# Patient Record
Sex: Female | Born: 1995 | Race: White | Hispanic: No | Marital: Single | Smoking: Never smoker
Health system: Southern US, Community
[De-identification: ages and names within clinical notes are randomized; demographics above are authoritative.]

---
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
## 2015-12-08 ENCOUNTER — Encounter: Payer: Self-pay | Admitting: Certified Nurse Midwife

## 2015-12-08 ENCOUNTER — Ambulatory Visit (INDEPENDENT_AMBULATORY_CARE_PROVIDER_SITE_OTHER): Payer: Managed Care, Other (non HMO) | Admitting: Certified Nurse Midwife

## 2015-12-08 VITALS — BP 122/60 | HR 68 | Resp 14 | Ht 70.0 in | Wt 186.0 lb

## 2015-12-08 DIAGNOSIS — Z0000 Encounter for general adult medical examination without abnormal findings: Secondary | ICD-10-CM | POA: Diagnosis not present

## 2015-12-08 DIAGNOSIS — Z01419 Encounter for gynecological examination (general) (routine) without abnormal findings: Secondary | ICD-10-CM

## 2015-12-08 LAB — CBC
HEMATOCRIT: 37 % (ref 36.0–46.0)
HEMOGLOBIN: 12.8 g/dL (ref 12.0–15.0)
MCH: 30.8 pg (ref 26.0–34.0)
MCHC: 34.6 g/dL (ref 30.0–36.0)
MCV: 89.2 fL (ref 78.0–100.0)
MPV: 9.7 fL (ref 8.6–12.4)
Platelets: 233 10*3/uL (ref 150–400)
RBC: 4.15 MIL/uL (ref 3.87–5.11)
RDW: 13.8 % (ref 11.5–15.5)
WBC: 7.7 10*3/uL (ref 4.0–10.5)

## 2015-12-08 LAB — TSH: TSH: 0.679 u[IU]/mL (ref 0.350–4.500)

## 2015-12-08 NOTE — Patient Instructions (Signed)
General topics  Next pap or exam is  due in 1 year Take a Women's multivitamin Take 1200 mg. of calcium daily - prefer dietary If any concerns in interim to call back  Breast Self-Awareness Practicing breast self-awareness may pick up problems early, prevent significant medical complications, and possibly save your life. By practicing breast self-awareness, you can become familiar with how your breasts look and feel and if your breasts are changing. This allows you to notice changes early. It can also offer you some reassurance that your breast health is good. One way to learn what is normal for your breasts and whether your breasts are changing is to do a breast self-exam. If you find a lump or something that was not present in the past, it is best to contact your caregiver right away. Other findings that should be evaluated by your caregiver include nipple discharge, especially if it is bloody; skin changes or reddening; areas where the skin seems to be pulled in (retracted); or new lumps and bumps. Breast pain is seldom associated with cancer (malignancy), but should also be evaluated by a caregiver. BREAST SELF-EXAM The best time to examine your breasts is 5 7 days after your menstrual period is over.  ExitCare Patient Information 2013 [employer].   Exercise to Stay Healthy Exercise helps you become and stay healthy. EXERCISE IDEAS AND TIPS Choose exercises that:  You enjoy.  Fit into your day. You do not need to exercise really hard to be healthy. You can do exercises at a slow or medium level and stay healthy. You can:  Stretch before and after working out.  Try yoga, Pilates, or tai chi.  Lift weights.  Walk fast, swim, jog, run, climb stairs, bicycle, dance, or rollerskate.  Take aerobic classes. Exercises that burn about 150 calories:  Running 1  miles in 15 minutes.  Playing volleyball for 45 to 60 minutes.  Washing and waxing a car for 45 to 60  minutes.  Playing touch football for 45 minutes.  Walking 1  miles in 35 minutes.  Pushing a stroller 1  miles in 30 minutes.  Playing basketball for 30 minutes.  Raking leaves for 30 minutes.  Bicycling 5 miles in 30 minutes.  Walking 2 miles in 30 minutes.  Dancing for 30 minutes.  Shoveling snow for 15 minutes.  Swimming laps for 20 minutes.  Walking up stairs for 15 minutes.  Bicycling 4 miles in 15 minutes.  Gardening for 30 to 45 minutes.  Jumping rope for 15 minutes.  Washing windows or floors for 45 to 60 minutes. Document Released: 01/14/2011 Document Revised: 03/05/2012 Document Reviewed: 01/14/2011 ExitCare Patient Information 2013 [employer].   Other topics ( that may be useful information):    Sexually Transmitted Disease Sexually transmitted disease (STD) refers to any infection that is passed from person to person during sexual activity. This may happen by way of saliva, semen, blood, vaginal mucus, or urine. Common STDs include:  Gonorrhea.  Chlamydia.  Syphilis.  HIV/AIDS.  Genital herpes.  Hepatitis B and C.  Trichomonas.  Human papillomavirus (HPV).  Pubic lice. CAUSES  An STD may be spread by bacteria, virus, or parasite. A person can get an STD by:  Sexual intercourse with an infected person.  Sharing sex toys with an infected person.  Sharing needles with an infected person.  Having intimate contact with the genitals, mouth, or rectal areas of an infected person. SYMPTOMS  Some people may not have any symptoms, but   they can still pass the infection to others. Different STDs have different symptoms. Symptoms include:  Painful or bloody urination.  Pain in the pelvis, abdomen, vagina, anus, throat, or eyes.  Skin rash, itching, irritation, growths, or sores (lesions). These usually occur in the genital or anal area.  Abnormal vaginal discharge.  Penile discharge in men.  Soft, flesh-colored skin growths in the  genital or anal area.  Fever.  Pain or bleeding during sexual intercourse.  Swollen glands in the groin area.  Yellow skin and eyes (jaundice). This is seen with hepatitis. DIAGNOSIS  To make a diagnosis, your caregiver may:  Take a medical history.  Perform a physical exam.  Take a specimen (culture) to be examined.  Examine a sample of discharge under a microscope.  Perform blood test TREATMENT   Chlamydia, gonorrhea, trichomonas, and syphilis can be cured with antibiotic medicine.  Genital herpes, hepatitis, and HIV can be treated, but not cured, with prescribed medicines. The medicines will lessen the symptoms.  Genital warts from HPV can be treated with medicine or by freezing, burning (electrocautery), or surgery. Warts may come back.  HPV is a virus and cannot be cured with medicine or surgery.However, abnormal areas may be followed very closely by your caregiver and may be removed from the cervix, vagina, or vulva through office procedures or surgery. If your diagnosis is confirmed, your recent sexual partners need treatment. This is true even if they are symptom-free or have a negative culture or evaluation. They should not have sex until their caregiver says it is okay. HOME CARE INSTRUCTIONS  All sexual partners should be informed, tested, and treated for all STDs.  Take your antibiotics as directed. Finish them even if you start to feel better.  Only take over-the-counter or prescription medicines for pain, discomfort, or fever as directed by your caregiver.  Rest.  Eat a balanced diet and drink enough fluids to keep your urine clear or pale yellow.  Do not have sex until treatment is completed and you have followed up with your caregiver. STDs should be checked after treatment.  Keep all follow-up appointments, Pap tests, and blood tests as directed by your caregiver.  Only use latex condoms and water-soluble lubricants during sexual activity. Do not use  petroleum jelly or oils.  Avoid alcohol and illegal drugs.  Get vaccinated for HPV and hepatitis. If you have not received these vaccines in the past, talk to your caregiver about whether one or both might be right for you.  Avoid risky sex practices that can break the skin. The only way to avoid getting an STD is to avoid all sexual activity.Latex condoms and dental dams (for oral sex) will help lessen the risk of getting an STD, but will not completely eliminate the risk. SEEK MEDICAL CARE IF:   You have a fever.  You have any new or worsening symptoms. Document Released: 03/04/2003 Document Revised: 03/05/2012 Document Reviewed: 03/11/2011 [facility] Patient Information 2013 [employer].    Domestic Abuse You are being battered or abused if someone close to you hits, pushes, or physically hurts you in any way. You also are being abused if you are forced into activities. You are being sexually abused if you are forced to have sexual contact of any kind. You are being emotionally abused if you are made to feel worthless or if you are constantly threatened. It is important to remember that help is available. No one has the right to abuse you. PREVENTION OF FURTHER  ABUSE  Learn the warning signs of danger. This varies with situations but may include: the use of alcohol, threats, isolation from friends and family, or forced sexual contact. Leave if you feel that violence is going to occur.  If you are attacked or beaten, report it to the police so the abuse is documented. You do not have to press charges. The police can protect you while you or the attackers are leaving. Get the officer's name and badge number and a copy of the report.  Find someone you can trust and tell them what is happening to you: your caregiver, a nurse, clergy member, close friend or family member. Feeling ashamed is natural, but remember that you have done nothing wrong. No one deserves abuse. Document Released:  12/09/2000 Document Revised: 03/05/2012 Document Reviewed: 02/17/2011 ExitCare Patient Information 2013 [employer].    How Much is Too Much Alcohol? Drinking too much alcohol can cause injury, accidents, and health problems. These types of problems can include:   Car crashes.  Falls.  Family fighting (domestic violence).  Drowning.  Fights.  Injuries.  Burns.  Damage to certain organs.  Having a baby with birth defects. ONE DRINK CAN BE TOO MUCH WHEN YOU ARE:  Working.  Pregnant or breastfeeding.  Taking medicines. Ask your doctor.  Driving or planning to drive. If you or someone you know has a drinking problem, get help from a doctor.  Document Released: 10/08/2009 Document Revised: 03/05/2012 Document Reviewed: 10/08/2009 ExitCare Patient Information 2013 [employer].   Smoking Hazards Smoking cigarettes is extremely bad for your health. Tobacco smoke has over 200 known poisons in it. There are over 60 chemicals in tobacco smoke that cause cancer. Some of the chemicals found in cigarette smoke include:   Cyanide.  Benzene.  Formaldehyde.  Methanol (wood alcohol).  Acetylene (fuel used in welding torches).  Ammonia. Cigarette smoke also contains the poisonous gases nitrogen oxide and carbon monoxide.  Cigarette smokers have an increased risk of many serious medical problems and Smoking causes approximately:  90% of all lung cancer deaths in men.  80% of all lung cancer deaths in women.  90% of deaths from chronic obstructive lung disease. Compared with nonsmokers, smoking increases the risk of:  Coronary heart disease by 2 to 4 times.  Stroke by 2 to 4 times.  Men developing lung cancer by 23 times.  Women developing lung cancer by 13 times.  Dying from chronic obstructive lung diseases by 12 times.  . Smoking is the most preventable cause of death and disease in our society.  WHY IS SMOKING ADDICTIVE?  Nicotine is the chemical  agent in tobacco that is capable of causing addiction or dependence.  When you smoke and inhale, nicotine is absorbed rapidly into the bloodstream through your lungs. Nicotine absorbed through the lungs is capable of creating a powerful addiction. Both inhaled and non-inhaled nicotine may be addictive.  Addiction studies of cigarettes and spit tobacco show that addiction to nicotine occurs mainly during the teen years, when young people begin using tobacco products. WHAT ARE THE BENEFITS OF QUITTING?  There are many health benefits to quitting smoking.   Likelihood of developing cancer and heart disease decreases. Health improvements are seen almost immediately.  Blood pressure, pulse rate, and breathing patterns start returning to normal soon after quitting. QUITTING SMOKING   American Lung Association - 1-800-LUNGUSA  American Cancer Society - 1-800-ACS-2345 Document Released: 01/19/2005 Document Revised: 03/05/2012 Document Reviewed: 09/23/2009 ExitCare Patient Information 2013 ExitCare,   LLC.   Stress Management Stress is a state of physical or mental tension that often results from changes in your life or normal routine. Some common causes of stress are:  Death of a loved one.  Injuries or severe illnesses.  Getting fired or changing jobs.  Moving into a new home. Other causes may be:  Sexual problems.  Business or financial losses.  Taking on a large debt.  Regular conflict with someone at home or at work.  Constant tiredness from lack of sleep. It is not just bad things that are stressful. It may be stressful to:  Win the lottery.  Get married.  Buy a new car. The amount of stress that can be easily tolerated varies from person to person. Changes generally cause stress, regardless of the types of change. Too much stress can affect your health. It may lead to physical or emotional problems. Too little stress (boredom) may also become stressful. SUGGESTIONS TO  REDUCE STRESS:  Talk things over with your family and friends. It often is helpful to share your concerns and worries. If you feel your problem is serious, you may want to get help from a professional counselor.  Consider your problems one at a time instead of lumping them all together. Trying to take care of everything at once may seem impossible. List all the things you need to do and then start with the most important one. Set a goal to accomplish 2 or 3 things each day. If you expect to do too many in a single day you will naturally fail, causing you to feel even more stressed.  Do not use alcohol or drugs to relieve stress. Although you may feel better for a short time, they do not remove the problems that caused the stress. They can also be habit forming.  Exercise regularly - at least 3 times per week. Physical exercise can help to relieve that "uptight" feeling and will relax you.  The shortest distance between despair and hope is often a good night's sleep.  Go to bed and get up on time allowing yourself time for appointments without being rushed.  Take a short "time-out" period from any stressful situation that occurs during the day. Close your eyes and take some deep breaths. Starting with the muscles in your face, tense them, hold it for a few seconds, then relax. Repeat this with the muscles in your neck, shoulders, hand, stomach, back and legs.  Take good care of yourself. Eat a balanced diet and get plenty of rest.  Schedule time for having fun. Take a break from your daily routine to relax. HOME CARE INSTRUCTIONS   Call if you feel overwhelmed by your problems and feel you can no longer manage them on your own.  Return immediately if you feel like hurting yourself or someone else. Document Released: 06/07/2001 Document Revised: 03/05/2012 Document Reviewed: 01/28/2008 ExitCare Patient Information 2013 ExitCare, [employer].   

## 2015-12-08 NOTE — Progress Notes (Signed)
Patient ID: Lindsey Galloway, female   DOB: February 22, 1996, 19 y.o.   MRN: [id number] 19 y.o. G0P0000 Single  Caucasian Fe here to establish gyn care and annual exam. Periods duration 7-8 days, 1-3 days cramping with OTC use,moderate to light. Uses Motrin for relief. No vomiting with period. Sees urgent care, prn. Fasted for Labs. Mother with patient, patient gave verbal consent for interview and all questions and management. Prefers mother not in for exam. No other health issues today.  Patient's last menstrual period was 12/04/2015.          Sexually active: No.  The current method of family planning is abstinence.    Exercising: Yes.    yoga  Smoker:  no  Health Maintenance: Pap:  Never TDaP: Never Labs: U/A- WNL Self Breast Exams: yes once a month   reports that she has never smoked. She has never used smokeless tobacco. She reports that she does not drink alcohol or use illicit drugs.  History reviewed. No pertinent past medical history.  History reviewed. No pertinent past surgical history.  Current Outpatient Prescriptions  Medication Sig Dispense Refill  . MAGNESIUM CARBONATE PO Take by mouth.    . Probiotic Product (PROBIOTIC ADVANCED PO) Take by mouth.     No current facility-administered medications for this visit.    Family History  Problem Relation Age of Onset  . Breast cancer Maternal Aunt   . Thyroid cancer Maternal Aunt   . Ovarian cancer Maternal Aunt   . Breast cancer Paternal Aunt   . Hypertension Paternal Aunt   . Diabetes Paternal Uncle   . Bladder Cancer Maternal Grandfather   . Diabetes Paternal Grandmother   . Congestive Heart Failure Paternal Grandfather     ROS:  Pertinent items are noted in HPI.  Otherwise, a comprehensive ROS was negative.  Exam:   BP 122/60 mmHg  Pulse 68  Resp 14  Ht  (1.778 m)  Wt 186 lb (84.369 kg)  BMI 26.69 kg/m2  LMP 12/04/2015 Height:  (177.8 cm) Ht Readings from Last 3 Encounters:  12/08/15  (1.778 m)     General appearance: alert, cooperative and appears stated age Head: Normocephalic, without obvious abnormality, atraumatic Neck: no adenopathy, supple, symmetrical, trachea midline and thyroid normal to inspection and palpation and non-palpable Lungs: clear to auscultation bilaterally Breasts: normal appearance, no masses or tenderness, No nipple retraction or dimpling, No nipple discharge or bleeding, No axillary or supraclavicular adenopathy Heart: regular rate and rhythm Abdomen: soft, non-tender; no masses,  no organomegaly Extremities: extremities normal, atraumatic, no cyanosis or edema Skin: Skin color, texture, turgor normal. No rashes or lesions Lymph nodes: Cervical, supraclavicular, and axillary nodes normal. No abnormal inguinal nodes palpated Neurologic: Grossly normal   Pelvic: External genitalia:  no lesions              Urethra:  normal appearing urethra with no masses, tenderness or lesions              Bartholin's and Skene's: normal                 Vagina: normal appearing vagina with normal color and discharge, no lesions              Cervix: normal, non tender, no lesions diva cup present removed for exam with scant blood noted              Pap taken: No. Bimanual Exam:  Uterus:  normal size, contour, position,  consistency, mobility, non-tender and anteverted              Adnexa: normal adnexa and no mass, fullness, tenderness               Rectovaginal: Confirms               Anus:  normal appearance  Chaperone present: yes.  A:  Well Woman with normal exam  Contraception abstinence  Screening labs  Gardasil Candidate   P:   Reviewed health and wellness pertinent to exam  Labs: TSH, CBC,Hep. B,C  Aware of benefits declines  Pap smear as above not taken   counseled on breast self exam, STD prevention, HIV risk factors and prevention, adequate intake of calcium and vitamin D, diet and exercise  return annually or prn  An After Visit Summary was  printed and given to the patient.

## 2015-12-09 LAB — HEPATITIS C ANTIBODY: HCV Ab: NEGATIVE

## 2015-12-09 LAB — HEPATITIS B SURFACE ANTIBODY,QUALITATIVE: HEP B S AB: NEGATIVE

## 2015-12-09 NOTE — Progress Notes (Signed)
Reviewed personally.  M. Suzanne Vanilla [place]ington, MD.  

## 2016-03-07 ENCOUNTER — Telehealth: Payer: Self-pay | Admitting: Certified Nurse Midwife

## 2016-03-07 NOTE — Telephone Encounter (Signed)
Patient complains about bump on head that's been there for 2 weeks now. Patient says it is dark red and tender to the touch. Best # to reach: [phone]

## 2016-03-07 NOTE — Telephone Encounter (Signed)
Left message to call Kaitlyn at [phone]. 

## 2016-03-08 NOTE — Telephone Encounter (Signed)
Spoke with patient. Patient states that she has a bump on her head that appeared 2 and 1/2 weeks ago. Reports the bump is tender to the touch and dark red in color. States that the bump will decrease in size intermittently. Bump is located on the back right side of her head. Advised she will need to be seen with her PCP for further evaluation and treatment. She is agreeable and will contact her PCP to schedule an appointment.  Routing to provider for final review. Patient agreeable to disposition. Will close encounter.

## 2018-08-14 ENCOUNTER — Ambulatory Visit (INDEPENDENT_AMBULATORY_CARE_PROVIDER_SITE_OTHER): Payer: Managed Care, Other (non HMO) | Admitting: Certified Nurse Midwife

## 2018-08-14 ENCOUNTER — Other Ambulatory Visit: Payer: Self-pay

## 2018-08-14 ENCOUNTER — Encounter: Payer: Self-pay | Admitting: Certified Nurse Midwife

## 2018-08-14 ENCOUNTER — Other Ambulatory Visit (HOSPITAL_COMMUNITY)
Admission: RE | Admit: 2018-08-14 | Discharge: 2018-08-14 | Disposition: A | Discharge status: Home / Self Care | Payer: Managed Care, Other (non HMO) | Source: Ambulatory Visit | Attending: Obstetrics & Gynecology | Admitting: Obstetrics & Gynecology

## 2018-08-14 VITALS — BP 104/64 | HR 68 | Resp 16 | Ht 70.25 in | Wt 186.0 lb

## 2018-08-14 DIAGNOSIS — Z124 Encounter for screening for malignant neoplasm of cervix: Secondary | ICD-10-CM

## 2018-08-14 DIAGNOSIS — N920 Excessive and frequent menstruation with regular cycle: Secondary | ICD-10-CM

## 2018-08-14 DIAGNOSIS — L989 Disorder of the skin and subcutaneous tissue, unspecified: Secondary | ICD-10-CM | POA: Diagnosis not present

## 2018-08-14 DIAGNOSIS — Z01419 Encounter for gynecological examination (general) (routine) without abnormal findings: Secondary | ICD-10-CM

## 2018-08-14 NOTE — Progress Notes (Signed)
22 y.o. G0P0000 Single  Caucasian Fe here for annual exam. Periods still 6-7 days using Diva cup and empties 2-3 times daily for 5 days and then light. Some cramping but uses Alieve with good response. Living on her own now.Has noted small red area on head that comes and goes for the past few years. Did not have as a child. Please check. Sees Urgent care if needed. Had physical last year to work in [place]care [place], [place] 6 months. Senior year in college may consider [employer] [place]Not sexually active ever. Declines all immunizations at this time. No health issues today.  Last LMP 08/04/18         Sexually active: No  The current method of family planning is abstinence.    Exercising: Yes.    walking Smoker:  no  Review of Systems  Constitutional: Negative.   HENT: Negative.   Eyes: Negative.   Respiratory: Negative.   Cardiovascular: Negative.   Gastrointestinal: Negative.   Genitourinary:       Painful cycles  Musculoskeletal: Negative.   Skin: Negative.   Neurological: Negative.   Endo/Heme/Allergies: Negative.   Psychiatric/Behavioral: Negative.     Health Maintenance: Pap:  none History of Abnormal Pap: no MMG:  none Self Breast exams: yes Colonoscopy:  none BMD:   none TDaP:  Never done, declined Shingles: not done Pneumonia: not done Hep C and HIV: hep c neg 2016 Labs: yes   reports that she has never smoked. She has never used smokeless tobacco. She reports that she does not drink alcohol or use drugs.  No past medical history on file.  No past surgical history on file.  Current Outpatient Medications  Medication Sig Dispense Refill  . MAGNESIUM CARBONATE PO Take by mouth.    . Probiotic Product (PROBIOTIC ADVANCED PO) Take by mouth.     No current facility-administered medications for this visit.     Family History  Problem Relation Age of Onset  . Breast cancer Maternal Aunt   . Thyroid cancer Maternal Aunt   . Ovarian cancer Maternal Aunt   .  Breast cancer Paternal Aunt   . Hypertension Paternal Aunt   . Diabetes Paternal Uncle   . Bladder Cancer Maternal Grandfather   . Diabetes Paternal Grandmother   . Congestive Heart Failure Paternal Grandfather     ROS:  Pertinent items are noted in HPI.  Otherwise, a comprehensive ROS was negative.  Exam:   There were no vitals taken for this visit.   Ht Readings from Last 3 Encounters:  12/08/15 5\' 10"  (1.778 m)    General appearance: alert, cooperative and appears stated age Head: Normocephalic, without obvious abnormality, atraumatic, small quarter size ? Hemangioma? noted on scalp, non tender Neck: no adenopathy, supple, symmetrical, trachea midline and thyroid normal to inspection and palpation Lungs: clear to auscultation bilaterally Breasts: normal appearance, no masses or tenderness, No nipple retraction or dimpling, No nipple discharge or bleeding, No axillary or supraclavicular adenopathy Heart: regular rate and rhythm Abdomen: soft, non-tender; no masses,  no organomegaly Extremities: extremities normal, atraumatic, no cyanosis or edema Skin: Skin color, texture, turgor normal. No rashes or lesions Lymph nodes: Cervical, supraclavicular, and axillary nodes normal. No abnormal inguinal nodes palpated Neurologic: Grossly normal   Pelvic: External genitalia:  no lesions              Urethra:  normal appearing urethra with no masses, tenderness or lesions  Bartholin's and Skene's: normal                 Vagina: normal appearing vagina with normal color and discharge, no lesions              Cervix: no cervical motion tenderness, no lesions and nulliparous appearance              Pap taken: Yes.   Bimanual Exam:  Uterus:  normal size, contour, position, consistency, mobility, non-tender and anteverted              Adnexa: normal adnexa and no mass, fullness, tenderness               Rectovaginal: Confirms               Anus:  Normal appearance, no  lesions  Chaperone present: yes  A:  Well Woman with normal exam  Contraception none needed or desired for cycle control, never sexually active  Menorrhagia with regular cycle  Gardasil candidate declined information, aware  Scalp lesion  P:   Reviewed health and wellness pertinent to exam  Discussed can use OCP for cycle control, patient does not feel needed.   Lab: CBC  Given dermatology information to call regarding evaluation of scalp lesion. Patient will call,does not need referral.  Pap smear: yes   counseled on breast self exam, mammography screening, STD prevention, HIV risk factors and prevention, adequate intake of calcium and vitamin D, diet and exercise  return annually or prn  An After Visit Summary was printed and given to the patient.

## 2018-08-14 NOTE — Patient Instructions (Addendum)
General topics  Next pap or exam is  due in 1 year Take a Women's multivitamin Take 1200 mg. of calcium daily - prefer dietary If any concerns in interim to call back  Breast Self-Awareness Practicing breast self-awareness may pick up problems early, prevent significant medical complications, and possibly save your life. By practicing breast self-awareness, you can become familiar with how your breasts look and feel and if your breasts are changing. This allows you to notice changes early. It can also offer you some reassurance that your breast health is good. One way to learn what is normal for your breasts and whether your breasts are changing is to do a breast self-exam. If you find a lump or something that was not present in the past, it is best to contact your caregiver right away. Other findings that should be evaluated by your caregiver include nipple discharge, especially if it is bloody; skin changes or reddening; areas where the skin seems to be pulled in (retracted); or new lumps and bumps. Breast pain is seldom associated with cancer (malignancy), but should also be evaluated by a caregiver. BREAST SELF-EXAM The best time to examine your breasts is 5 7 days after your menstrual period is over.  ExitCare Patient Information 2013 ExitCare[employer].   Exercise to Stay Healthy Exercise helps you become and stay healthy. EXERCISE IDEAS AND TIPS Choose exercises that:  You enjoy.  Fit into your day. You do not need to exercise really hard to be healthy. You can do exercises at a slow or medium level and stay healthy. You can:  Stretch before and after working out.  Try yoga, Pilates, or tai chi.  Lift weights.  Walk fast, swim, jog, run, climb stairs, bicycle, dance, or rollerskate.  Take aerobic classes. Exercises that burn about 150 calories:  Running 1  miles in 15 minutes.  Playing volleyball for 45 to 60 minutes.  Washing and waxing a car for 45 to 60  minutes.  Playing touch football for 45 minutes.  Walking 1  miles in 35 minutes.  Pushing a stroller 1  miles in 30 minutes.  Playing basketball for 30 minutes.  Raking leaves for 30 minutes.  Bicycling 5 miles in 30 minutes.  Walking 2 miles in 30 minutes.  Dancing for 30 minutes.  Shoveling snow for 15 minutes.  Swimming laps for 20 minutes.  Walking up stairs for 15 minutes.  Bicycling 4 miles in 15 minutes.  Gardening for 30 to 45 minutes.  Jumping rope for 15 minutes.  Washing windows or floors for 45 to 60 minutes. Document Released: 01/14/2011 Document Revised: 03/05/2012 Document Reviewed: 01/14/2011 ExitCare Patient Information 2013 ExitCare[employer].   Other topics ( that may be useful information):    Sexually Transmitted Disease Sexually transmitted disease (STD) refers to any infection that is passed from person to person during sexual activity. This may happen by way of saliva, semen, blood, vaginal mucus, or urine. Common STDs include:  Gonorrhea.  Chlamydia.  Syphilis.  HIV/AIDS.  Genital herpes.  Hepatitis B and C.  Trichomonas.  Human papillomavirus (HPV).  Pubic lice. CAUSES  An STD may be spread by bacteria, virus, or parasite. A person can get an STD by:  Sexual intercourse with an infected person.  Sharing sex toys with an infected person.  Sharing needles with an infected person.  Having intimate contact with the genitals, mouth, or rectal areas of an infected person. SYMPTOMS  Some people may not have any symptoms, but   they can still pass the infection to others. Different STDs have different symptoms. Symptoms include:  Painful or bloody urination.  Pain in the pelvis, abdomen, vagina, anus, throat, or eyes.  Skin rash, itching, irritation, growths, or sores (lesions). These usually occur in the genital or anal area.  Abnormal vaginal discharge.  Penile discharge in men.  Soft, flesh-colored skin growths in the  genital or anal area.  Fever.  Pain or bleeding during sexual intercourse.  Swollen glands in the groin area.  Yellow skin and eyes (jaundice). This is seen with hepatitis. DIAGNOSIS  To make a diagnosis, your caregiver may:  Take a medical history.  Perform a physical exam.  Take a specimen (culture) to be examined.  Examine a sample of discharge under a microscope.  Perform blood test TREATMENT   Chlamydia, gonorrhea, trichomonas, and syphilis can be cured with antibiotic medicine.  Genital herpes, hepatitis, and HIV can be treated, but not cured, with prescribed medicines. The medicines will lessen the symptoms.  Genital warts from HPV can be treated with medicine or by freezing, burning (electrocautery), or surgery. Warts may come back.  HPV is a virus and cannot be cured with medicine or surgery.However, abnormal areas may be followed very closely by your caregiver and may be removed from the cervix, vagina, or vulva through office procedures or surgery. If your diagnosis is confirmed, your recent sexual partners need treatment. This is true even if they are symptom-free or have a negative culture or evaluation. They should not have sex until their caregiver says it is okay. HOME CARE INSTRUCTIONS  All sexual partners should be informed, tested, and treated for all STDs.  Take your antibiotics as directed. Finish them even if you start to feel better.  Only take over-the-counter or prescription medicines for pain, discomfort, or fever as directed by your caregiver.  Rest.  Eat a balanced diet and drink enough fluids to keep your urine clear or pale yellow.  Do not have sex until treatment is completed and you have followed up with your caregiver. STDs should be checked after treatment.  Keep all follow-up appointments, Pap tests, and blood tests as directed by your caregiver.  Only use latex condoms and water-soluble lubricants during sexual activity. Do not use  petroleum jelly or oils.  Avoid alcohol and illegal drugs.  Get vaccinated for HPV and hepatitis. If you have not received these vaccines in the past, talk to your caregiver about whether one or both might be right for you.  Avoid risky sex practices that can break the skin. The only way to avoid getting an STD is to avoid all sexual activity.Latex condoms and dental dams (for oral sex) will help lessen the risk of getting an STD, but will not completely eliminate the risk. SEEK MEDICAL CARE IF:   You have a fever.  You have any new or worsening symptoms. Document Released: 03/04/2003 Document Revised: 03/05/2012 Document Reviewed: 03/11/2011 [facility] Patient Information 2013 [place].    Domestic Abuse You are being battered or abused if someone close to you hits, pushes, or physically hurts you in any way. You also are being abused if you are forced into activities. You are being sexually abused if you are forced to have sexual contact of any kind. You are being emotionally abused if you are made to feel worthless or if you are constantly threatened. It is important to remember that help is available. No one has the right to abuse you. PREVENTION OF FURTHER  ABUSE  Learn the warning signs of danger. This varies with situations but may include: the use of alcohol, threats, isolation from friends and family, or forced sexual contact. Leave if you feel that violence is going to occur.  If you are attacked or beaten, report it to the police so the abuse is documented. You do not have to press charges. The police can protect you while you or the attackers are leaving. Get the officer's name and badge number and a copy of the report.  Find someone you can trust and tell them what is happening to you: your caregiver, a nurse, clergy member, close friend or family member. Feeling ashamed is natural, but remember that you have done nothing wrong. No one deserves abuse. Document Released:  12/09/2000 Document Revised: 03/05/2012 Document Reviewed: 02/17/2011 ExitCare Patient Information 2013 ExitCare, [employer].    How Much is Too Much Alcohol? Drinking too much alcohol can cause injury, accidents, and health problems. These types of problems can include:   Car crashes.  Falls.  Family fighting (domestic violence).  Drowning.  Fights.  Injuries.  Burns.  Damage to certain organs.  Having a baby with birth defects. ONE DRINK CAN BE TOO MUCH WHEN YOU ARE:  Working.  Pregnant or breastfeeding.  Taking medicines. Ask your doctor.  Driving or planning to drive. If you or someone you know has a drinking problem, get help from a doctor.  Document Released: 10/08/2009 Document Revised: 03/05/2012 Document Reviewed: 10/08/2009 ExitCare Patient Information 2013 ExitCare, [employer].   Smoking Hazards Smoking cigarettes is extremely bad for your health. Tobacco smoke has over 200 known poisons in it. There are over 60 chemicals in tobacco smoke that cause cancer. Some of the chemicals found in cigarette smoke include:   Cyanide.  Benzene.  Formaldehyde.  Methanol (wood alcohol).  Acetylene (fuel used in welding torches).  Ammonia. Cigarette smoke also contains the poisonous gases nitrogen oxide and carbon monoxide.  Cigarette smokers have an increased risk of many serious medical problems and Smoking causes approximately:  90% of all lung cancer deaths in men.  80% of all lung cancer deaths in women.  90% of deaths from chronic obstructive lung disease. Compared with nonsmokers, smoking increases the risk of:  Coronary heart disease by 2 to 4 times.  Stroke by 2 to 4 times.  Men developing lung cancer by 23 times.  Women developing lung cancer by 13 times.  Dying from chronic obstructive lung diseases by 12 times.  . Smoking is the most preventable cause of death and disease in our society.  WHY IS SMOKING ADDICTIVE?  Nicotine is the chemical  agent in tobacco that is capable of causing addiction or dependence.  When you smoke and inhale, nicotine is absorbed rapidly into the bloodstream through your lungs. Nicotine absorbed through the lungs is capable of creating a powerful addiction. Both inhaled and non-inhaled nicotine may be addictive.  Addiction studies of cigarettes and spit tobacco show that addiction to nicotine occurs mainly during the teen years, when young people begin using tobacco products. WHAT ARE THE BENEFITS OF QUITTING?  There are many health benefits to quitting smoking.   Likelihood of developing cancer and heart disease decreases. Health improvements are seen almost immediately.  Blood pressure, pulse rate, and breathing patterns start returning to normal soon after quitting. QUITTING SMOKING   American Lung Association - 1-800-LUNGUSA  American Cancer Society - 1-800-ACS-2345 Document Released: 01/19/2005 Document Revised: 03/05/2012 Document Reviewed: 09/23/2009 ExitCare Patient Information 2013 ExitCare,   LLC.   Stress Management Stress is a state of physical or mental tension that often results from changes in your life or normal routine. Some common causes of stress are:  Death of a loved one.  Injuries or severe illnesses.  Getting fired or changing jobs.  Moving into a new home. Other causes may be:  Sexual problems.  Business or financial losses.  Taking on a large debt.  Regular conflict with someone at home or at work.  Constant tiredness from lack of sleep. It is not just bad things that are stressful. It may be stressful to:  Win the lottery.  Get married.  Buy a new car. The amount of stress that can be easily tolerated varies from person to person. Changes generally cause stress, regardless of the types of change. Too much stress can affect your health. It may lead to physical or emotional problems. Too little stress (boredom) may also become stressful. SUGGESTIONS TO  REDUCE STRESS:  Talk things over with your family and friends. It often is helpful to share your concerns and worries. If you feel your problem is serious, you may want to get help from a professional counselor.  Consider your problems one at a time instead of lumping them all together. Trying to take care of everything at once may seem impossible. List all the things you need to do and then start with the most important one. Set a goal to accomplish 2 or 3 things each day. If you expect to do too many in a single day you will naturally fail, causing you to feel even more stressed.  Do not use alcohol or drugs to relieve stress. Although you may feel better for a short time, they do not remove the problems that caused the stress. They can also be habit forming.  Exercise regularly - at least 3 times per week. Physical exercise can help to relieve that "uptight" feeling and will relax you.  The shortest distance between despair and hope is often a good night's sleep.  Go to bed and get up on time allowing yourself time for appointments without being rushed.  Take a short "time-out" period from any stressful situation that occurs during the day. Close your eyes and take some deep breaths. Starting with the muscles in your face, tense them, hold it for a few seconds, then relax. Repeat this with the muscles in your neck, shoulders, hand, stomach, back and legs.  Take good care of yourself. Eat a balanced diet and get plenty of rest.  Schedule time for having fun. Take a break from your daily routine to relax. HOME CARE INSTRUCTIONS   Call if you feel overwhelmed by your problems and feel you can no longer manage them on your own.  Return immediately if you feel like hurting yourself or someone else. Document Released: 06/07/2001 Document Revised: 03/05/2012 Document Reviewed: 01/28/2008 [facility] Patient Information 2013 [place].  Good to see you Schedule dermatology  appointment! Debbi

## 2018-08-15 LAB — CBC
HEMATOCRIT: 38.1 % (ref 34.0–46.6)
Hemoglobin: 12.6 g/dL (ref 11.1–15.9)
MCH: 30.8 pg (ref 26.6–33.0)
MCHC: 33.1 g/dL (ref 31.5–35.7)
MCV: 93 fL (ref 79–97)
PLATELETS: 262 10*3/uL (ref 150–450)
RBC: 4.09 x10E6/uL (ref 3.77–5.28)
RDW: 13.4 % (ref 12.3–15.4)
WBC: 4.6 10*3/uL (ref 3.4–10.8)

## 2018-08-15 LAB — CYTOLOGY - PAP: Diagnosis: NEGATIVE

## 2019-08-16 ENCOUNTER — Encounter: Payer: Self-pay | Admitting: Certified Nurse Midwife

## 2019-08-16 ENCOUNTER — Ambulatory Visit: Payer: Managed Care, Other (non HMO) | Admitting: Certified Nurse Midwife

## 2019-08-16 NOTE — Progress Notes (Deleted)
23 y.o. G0P0000 Single  {Race/ethnicity:17218} Fe here for annual exam.    No LMP recorded.          Sexually active: {yes no:314532}  The current method of family planning is {contraception:315051}.    Exercising: {yes no:314532}  {types:19826} Smoker:  {YES NO:22349}  ROS  Health Maintenance: Pap:  08-14-18 neg History of Abnormal Pap: {YES NO:22349} MMG:  none Self Breast exams: {YES NO:22349} Colonoscopy:  none BMD:   none TDaP:  Never done, declined Shingles: not done Pneumonia: not done Hep C and HIV: hep c neg 2016 Labs: ***   reports that she has never smoked. She has never used smokeless tobacco. She reports that she does not drink alcohol or use drugs.  No past medical history on file.  No past surgical history on file.  Current Outpatient Medications  Medication Sig Dispense Refill  . MAGNESIUM CARBONATE PO Take by mouth.    . Probiotic Product (PROBIOTIC ADVANCED PO) Take by mouth.     No current facility-administered medications for this visit.     Family History  Problem Relation Age of Onset  . Breast cancer Maternal Aunt   . Thyroid cancer Maternal Aunt   . Ovarian cancer Maternal Aunt   . Breast cancer Paternal Aunt   . Hypertension Paternal Aunt   . Diabetes Paternal Uncle   . Bladder Cancer Maternal Grandfather   . Diabetes Paternal Grandmother   . Congestive Heart Failure Paternal Grandfather     ROS:  Pertinent items are noted in HPI.  Otherwise, a comprehensive ROS was negative.  Exam:   There were no vitals taken for this visit.   Ht Readings from Last 3 Encounters:  08/14/18 5' 10.25" (1.784 m)  12/08/15 5\' 10"  (1.778 m)    General appearance: alert, cooperative and appears stated age Head: Normocephalic, without obvious abnormality, atraumatic Neck: no adenopathy, supple, symmetrical, trachea midline and thyroid {EXAM; THYROID:18604} Lungs: clear to auscultation bilaterally Breasts: {Exam; breast:13139::"normal appearance, no  masses or tenderness"} Heart: regular rate and rhythm Abdomen: soft, non-tender; no masses,  no organomegaly Extremities: extremities normal, atraumatic, no cyanosis or edema Skin: Skin color, texture, turgor normal. No rashes or lesions Lymph nodes: Cervical, supraclavicular, and axillary nodes normal. No abnormal inguinal nodes palpated Neurologic: Grossly normal   Pelvic: External genitalia:  no lesions              Urethra:  normal appearing urethra with no masses, tenderness or lesions              Bartholin's and Skene's: normal                 Vagina: normal appearing vagina with normal color and discharge, no lesions              Cervix: {exam; cervix:14595}              Pap taken: {yes no:314532} Bimanual Exam:  Uterus:  {exam; uterus:12215}              Adnexa: {exam; adnexa:12223}               Rectovaginal: Confirms               Anus:  normal sphincter tone, no lesions  Chaperone present: ***  A:  Well Woman with normal exam  P:   Reviewed health and wellness pertinent to exam  Pap smear: {YES NO:22349}  {plan; gyn:5269::"mammogram","pap smear","return annually or prn"}  An After  Visit Summary was printed and given to the patient.

## 2020-01-09 ENCOUNTER — Other Ambulatory Visit: Payer: Self-pay

## 2020-01-09 ENCOUNTER — Ambulatory Visit: Payer: Managed Care, Other (non HMO) | Admitting: Family Medicine

## 2020-01-09 ENCOUNTER — Encounter: Payer: Self-pay | Admitting: Family Medicine

## 2020-01-09 VITALS — BP 122/77 | HR 68 | Temp 97.9°F | Ht 70.25 in | Wt 208.2 lb

## 2020-01-09 DIAGNOSIS — Z0000 Encounter for general adult medical examination without abnormal findings: Secondary | ICD-10-CM

## 2020-01-09 DIAGNOSIS — Z0001 Encounter for general adult medical examination with abnormal findings: Secondary | ICD-10-CM

## 2020-01-09 DIAGNOSIS — N920 Excessive and frequent menstruation with regular cycle: Secondary | ICD-10-CM

## 2020-01-09 MED ORDER — ETONOGESTREL-ETHINYL ESTRADIOL 0.12-0.015 MG/24HR VA RING: VAGINAL_RING | VAGINAL | 12 refills | Status: AC

## 2020-01-09 NOTE — Patient Instructions (Addendum)
Start NUVARING with the start of your next period  Immunizations that would be recommended: MMR, varicella, Tdap, HPV and annual flu vaccine Also consider hepatitis A and B vaccines  If you have lab work done today you will be contacted with your lab results within the next 2 weeks.  If you have not heard from [place] then please contact us. The fastest way to get your results is to register for My Chart.   IF you received an x-ray today, you will receive an invoice from [facility]. Please contact [facility] at [phone] with questions or concerns regarding your invoice.   IF you received labwork today, you will receive an invoice from [facility]. Please contact [facility] at [phone] with questions or concerns regarding your invoice.   Our billing staff will not be able to assist you with questions regarding bills from these companies.  You will be contacted with the lab results as soon as they are available. The fastest way to get your results is to activate your My Chart account. Instructions are located on the last page of this paperwork. If you have not heard from [place] regarding the results in 2 weeks, please contact this office.     Preventive Care 9-44 Years Old, Female Preventive care refers to visits with your health care provider and lifestyle choices that can promote health and wellness. This includes:  A yearly physical exam. This may also be called an annual well check.  Regular dental visits and eye exams.  Immunizations.  Screening for certain conditions.  Healthy lifestyle choices, such as eating a healthy diet, getting regular exercise, not using drugs or products that contain nicotine and tobacco, and limiting alcohol use. What can I expect for my preventive care visit? Physical exam Your health care provider will check your:  Height and weight. This may be used to calculate body mass index (BMI), which tells if you are at a healthy  weight.  Heart rate and blood pressure.  Skin for abnormal spots. Counseling Your health care provider may ask you questions about your:  Alcohol, tobacco, and drug use.  Emotional well-being.  Home and relationship well-being.  Sexual activity.  Eating habits.  Work and work Statistician.  Method of birth control.  Menstrual cycle.  Pregnancy history. What immunizations do I need?  Influenza (flu) vaccine  This is recommended every year. Tetanus, diphtheria, and pertussis (Tdap) vaccine  You may need a Td booster every 10 years. Varicella (chickenpox) vaccine  You may need this if you have not been vaccinated. Human papillomavirus (HPV) vaccine  If recommended by your health care provider, you may need three doses over 6 months. Measles, mumps, and rubella (MMR) vaccine  You may need at least one dose of MMR. You may also need a second dose. Meningococcal conjugate (MenACWY) vaccine  One dose is recommended if you are age 61-21 years and a first-year college student living in a residence hall, or if you have one of several medical conditions. You may also need additional booster doses. Pneumococcal conjugate (PCV13) vaccine  You may need this if you have certain conditions and were not previously vaccinated. Pneumococcal polysaccharide (PPSV23) vaccine  You may need one or two doses if you smoke cigarettes or if you have certain conditions. Hepatitis A vaccine  You may need this if you have certain conditions or if you travel or work in places where you may be exposed to hepatitis A. Hepatitis B vaccine  You may need this if  you have certain conditions or if you travel or work in places where you may be exposed to hepatitis B. Haemophilus influenzae type b (Hib) vaccine  You may need this if you have certain conditions. You may receive vaccines as individual doses or as more than one vaccine together in one shot (combination vaccines). Talk with your health  care provider about the risks and benefits of combination vaccines. What tests do I need?  Blood tests  Lipid and cholesterol levels. These may be checked every 5 years starting at age 68.  Hepatitis C test.  Hepatitis B test. Screening  Diabetes screening. This is done by checking your blood sugar (glucose) after you have not eaten for a while (fasting).  Sexually transmitted disease (STD) testing.  BRCA-related cancer screening. This may be done if you have a family history of breast, ovarian, tubal, or peritoneal cancers.  Pelvic exam and Pap test. This may be done every 3 years starting at age 74. Starting at age 38, this may be done every 5 years if you have a Pap test in combination with an HPV test. Talk with your health care provider about your test results, treatment options, and if necessary, the need for more tests. Follow these instructions at home: Eating and drinking   Eat a diet that includes fresh fruits and vegetables, whole grains, lean protein, and low-fat dairy.  Take vitamin and mineral supplements as recommended by your health care provider.  Do not drink alcohol if: ? Your health care provider tells you not to drink. ? You are pregnant, may be pregnant, or are planning to become pregnant.  If you drink alcohol: ? Limit how much you have to 0-1 drink a day. ? Be aware of how much alcohol is in your drink. In the [place] one drink equals one 12 oz bottle of beer (355 mL), one 5 oz glass of wine (148 mL), or one 1 oz glass of hard liquor (44 mL). Lifestyle  Take daily care of your teeth and gums.  Stay active. Exercise for at least 30 minutes on 5 or more days each week.  Do not use any products that contain nicotine or tobacco, such as cigarettes, e-cigarettes, and chewing tobacco. If you need help quitting, ask your health care provider.  If you are sexually active, practice safe sex. Use a condom or other form of birth control (contraception) in order  to prevent pregnancy and STIs (sexually transmitted infections). If you plan to become pregnant, see your health care provider for a preconception visit. What's next?  Visit your health care provider once a year for a well check visit.  Ask your health care provider how often you should have your eyes and teeth checked.  Stay up to date on all vaccines. This information is not intended to replace advice given to you by your health care provider. Make sure you discuss any questions you have with your health care provider. Document Revised: 08/23/2018 Document Reviewed: 08/23/2018 Elsevier Patient Education  2020 [employer].

## 2020-01-09 NOTE — Progress Notes (Signed)
1/14/20218:17 AM  Lindsey Galloway 08-23-1996, 24 y.o., female [id number]  Chief Complaint  Patient presents with  . Annual Exam    HPI:   Patient is a 24 y.o. female who presents today for CPE  G&Ps: 0 Pap: aug 2019, normal, at Ob[facility]yn STD: has never been sexually active BC : none Menses: regular, heavy and painful, interested in starting nuvaring for management of menorrhagia, LMP dec 25th 2020 Mammogram: none FHX breast/ovarian cancer: maternal and paternal aunts FHx colon cancer: none Exercise/diet: has active job, 3 mile walk 3 times a week, mostly home cooked meals, no restrictions Has no childhood immunizations, starting to consider becoming immunized Eye/dentist: has not seen them in the past year  Lab Results  Component Value Date   WBC 4.6 08/14/2018   HGB 12.6 08/14/2018   HCT 38.1 08/14/2018   MCV 93 08/14/2018   PLT 262 08/14/2018     Hearing Screening   125Hz  250Hz  500Hz  1000Hz  2000Hz  3000Hz  4000Hz  6000Hz  8000Hz   Right ear:           Left ear:             Visual Acuity Screening   Right eye Left eye Both eyes  Without correction:     With correction: 20/20 20/20 20/25     There is no immunization history on file for this patient.  Depression screen PHQ 2/9 01/09/2020  Decreased Interest 0  Down, Depressed, Hopeless 0  PHQ - 2 Score 0    Fall Risk  01/09/2020  Falls in the past year? 0  Number falls in past yr: 0  Injury with Fall? 0     No Known Allergies  Prior to Admission medications   Medication Sig Start Date End Date Taking? Authorizing Provider  MAGNESIUM CARBONATE PO Take by mouth.    [provider]  Probiotic Product (PROBIOTIC ADVANCED PO) Take by mouth.    [provider]    History reviewed. No pertinent past medical history.  History reviewed. No pertinent surgical history.  Social History   Tobacco Use  . Smoking status: Never Smoker  . Smokeless tobacco: Never Used  Substance Use Topics  . Alcohol  use: No    Alcohol/week: 0.0 - 1.0 standard drinks    Family History  Problem Relation Age of Onset  . Breast cancer Maternal Aunt   . Thyroid cancer Maternal Aunt   . Ovarian cancer Maternal Aunt   . Breast cancer Paternal Aunt   . Hypertension Paternal Aunt   . Diabetes Paternal Uncle   . Bladder Cancer Maternal Grandfather   . Diabetes Paternal Grandmother   . Congestive Heart Failure Paternal Grandfather     Review of Systems  Constitutional: Negative for chills and fever.  Respiratory: Negative for cough and shortness of breath.   Cardiovascular: Negative for chest pain, palpitations and leg swelling.  Gastrointestinal: Negative for abdominal pain, nausea and vomiting.  All other systems reviewed and are negative.    OBJECTIVE:  Today's Vitals   01/09/20 0815  BP: 122/77  Pulse: 68  Temp: 97.9 F (36.6 C)  SpO2: 97%  Weight: 208 lb 3.2 oz (94.4 kg)  Height: 5' 10.25" (1.784 m)   Body mass index is 29.66 kg/m.  Wt Readings from Last 3 Encounters:  01/09/20 208 lb 3.2 oz (94.4 kg)  08/14/18 186 lb (84.4 kg)  12/08/15 186 lb (84.4 kg)    Physical Exam Vitals and nursing note reviewed.  Constitutional:  Appearance: She is well-developed.  HENT:     Head: Normocephalic and atraumatic.     Right Ear: Hearing, tympanic membrane, ear canal and external ear normal.     Left Ear: Hearing, tympanic membrane, ear canal and external ear normal.     Mouth/Throat:     Mouth: Mucous membranes are moist.     Pharynx: No oropharyngeal exudate or posterior oropharyngeal erythema.  Eyes:     Extraocular Movements: Extraocular movements intact.     Conjunctiva/sclera: Conjunctivae normal.     Pupils: Pupils are equal, round, and reactive to light.  Neck:     Thyroid: No thyromegaly.  Cardiovascular:     Rate and Rhythm: Normal rate and regular rhythm.     Heart sounds: Normal heart sounds. No murmur. No friction rub. No gallop.   Pulmonary:     Effort:  Pulmonary effort is normal.     Breath sounds: Normal breath sounds. No wheezing, rhonchi or rales.  Abdominal:     General: Bowel sounds are normal. There is no distension.     Palpations: Abdomen is soft. There is no hepatomegaly, splenomegaly or mass.     Tenderness: There is no abdominal tenderness.  Musculoskeletal:        General: Normal range of motion.     Cervical back: Neck supple.     Right lower leg: No edema.     Left lower leg: No edema.  Lymphadenopathy:     Cervical: No cervical adenopathy.  Skin:    General: Skin is warm and dry.  Neurological:     Mental Status: She is alert and oriented to person, place, and time.     Cranial Nerves: No cranial nerve deficit.     Gait: Gait normal.     Deep Tendon Reflexes: Reflexes are normal and symmetric.  Psychiatric:        Mood and Affect: Mood normal.        Behavior: Behavior normal.     No results found for this or any previous visit (from the past 24 hour(s)).  No results found.   ASSESSMENT and PLAN  1. Annual physical exam No concerns per history or exam. HCM reviewed/discussed, including immunizations. Anticipatory guidance regarding healthy weight, lifestyle and choices given.   2. Menorrhagia with regular cycle New med r/se/b and RTC precautions. Patient educational handout given.   Other orders - etonogestrel-ethinyl estradiol (NUVARING) 0.12-0.015 MG/24HR vaginal ring; Insert vaginally and leave in place for 3 consecutive weeks, then remove for 1 week.  Return in about 3 months (around 04/08/2020) for menorrhagia.    Rutherford Guys, MD [facility] [place], [place] [address] Ph.  [phone] Fax [phone]

## 2020-02-10 ENCOUNTER — Ambulatory Visit: Payer: Self-pay

## 2020-02-10 NOTE — Telephone Encounter (Signed)
Patient called stating that she has had a severe to moderate HA for 2 weeks. She denies Hx of headaches.  She has stated Nuvaring 01/09/20. She rates her pain today at 4 and states it is left frontal and travels down into her neck. At max severity she rates pain at 7. She can not touch her chin to her chest.  She has had no head injury. She has no fever or other symptoms. Per protocol patient will go to ER for evaluation of symptoms. Care advice read to patient.  She verbalized understanding.  Reason for Disposition . Stiff neck (can't touch chin to chest)  Answer Assessment - Initial Assessment Questions 1. LOCATION: "Where does it hurt?"      Front left travels to neck 2. ONSET: "When did the headache start?" (Minutes, hours or days)      2 weeks ago 3. PATTERN: "Does the pain come and go, or has it been constant since it started?"     constant 4. SEVERITY: "How bad is the pain?" and "What does it keep you from doing?"  (e.g., Scale 1-10; mild, moderate, or severe)   - MILD (1-3): doesn't interfere with normal activities    - MODERATE (4-7): interferes with normal activities or awakens from sleep    - SEVERE (8-10): excruciating pain, unable to do any normal activities        4 today most severe has been 7 5. RECURRENT SYMPTOM: "Have you ever had headaches before?" If so, ask: "When was the last time?" and "What happened that time?"      no 6. CAUSE: "What do you think is causing the headache?"    Novo ring 7. MIGRAINE: "Have you been diagnosed with migraine headaches?" If so, ask: "Is this headache similar?"      No 8. HEAD INJURY: "Has there been any recent injury to the head?"     No 9. OTHER SYMPTOMS: "Do you have any other symptoms?" (fever, stiff neck, eye pain, sore throat, cold symptoms)     No some days neck is stiff 10. PREGNANCY: "Is there any chance you are pregnant?" "When was your last menstrual period?"       No  Protocols used: HEADACHE-A-AH

## 2020-03-18 ENCOUNTER — Encounter: Payer: Self-pay | Admitting: Certified Nurse Midwife

## 2020-04-06 ENCOUNTER — Other Ambulatory Visit: Payer: Self-pay

## 2020-04-06 ENCOUNTER — Ambulatory Visit: Payer: Managed Care, Other (non HMO) | Admitting: Family Medicine

## 2020-04-06 ENCOUNTER — Ambulatory Visit: Payer: Managed Care, Other (non HMO) | Admitting: Registered Nurse

## 2020-04-06 ENCOUNTER — Encounter: Payer: Self-pay | Admitting: Registered Nurse

## 2020-04-06 VITALS — BP 120/61 | HR 72 | Temp 98.2°F | Resp 18 | Ht 70.25 in | Wt 208.2 lb

## 2020-04-06 DIAGNOSIS — N920 Excessive and frequent menstruation with regular cycle: Secondary | ICD-10-CM

## 2020-04-06 MED ORDER — NORETHINDRONE 0.35 MG PO TABS: 1.0000 | ORAL_TABLET | Freq: Every day | ORAL | 4 refills | Status: AC

## 2020-04-06 NOTE — Patient Instructions (Signed)
° ° ° °  If you have lab work done today you will be contacted with your lab results within the next 2 weeks.  If you have not heard from us then please contact us. The fastest way to get your results is to register for My Chart. ° ° °IF you received an x-ray today, you will receive an invoice from [facility]. Please contact [facility] at [phone] with questions or concerns regarding your invoice.  ° °IF you received labwork today, you will receive an invoice from [facility]. Please contact [facility] at [phone] with questions or concerns regarding your invoice.  ° °Our billing staff will not be able to assist you with questions regarding bills from these companies. ° °You will be contacted with the lab results as soon as they are available. The fastest way to get your results is to activate your My Chart account. Instructions are located on the last page of this paperwork. If you have not heard from us regarding the results in 2 weeks, please contact this office. °  ° ° ° °

## 2020-04-06 NOTE — Progress Notes (Signed)
Established Patient Office Visit  Subjective:  Patient ID: Lindsey Galloway, female    DOB: 1996-07-05  Age: 24 y.o. MRN: [id number]  CC:  Chief Complaint  Patient presents with  . Follow-up    3 month follow up . patient states she had the nuva ring in since her last visit she stated that she has been experiencing  some severe migrains and starts to see spots.    HPI Lindsey Galloway presents for ongoing menorrhagia.  Reports that after last visit, started Nuvaring. Unfortunately, had severe migraines with aura and stopped this a few days early during the first ring. Did not resume. Unfortunately heavy menses persisted.  Willing to try another option at this time.   No lightheadedness, dizziness, shob, chest pain, ongoing headaches, ongoing visual changes.  No past medical history on file.  No past surgical history on file.  Family History  Problem Relation Age of Onset  . Breast cancer Maternal Aunt   . Thyroid cancer Maternal Aunt   . Ovarian cancer Maternal Aunt   . Breast cancer Paternal Aunt   . Hypertension Paternal Aunt   . Diabetes Paternal Uncle   . Bladder Cancer Maternal Grandfather   . Diabetes Paternal Grandmother   . Congestive Heart Failure Paternal Grandfather     Social History   Socioeconomic History  . Marital status: Single    Spouse name: Not on file  . Number of children: Not on file  . Years of education: Not on file  . Highest education level: Not on file  Occupational History  . Not on file  Tobacco Use  . Smoking status: Never Smoker  . Smokeless tobacco: Never Used  Substance and Sexual Activity  . Alcohol use: No    Alcohol/week: 0.0 - 1.0 standard drinks  . Drug use: No  . Sexual activity: Not Currently    Partners: Male    Birth control/protection: Abstinence  Other Topics Concern  . Not on file  Social History Narrative  . Not on file   Social Determinants of Health   Financial Resource Strain:   . Difficulty of Paying Living  Expenses:   Food Insecurity:   . Worried About Charity fundraiser in the Last Year:   . Arboriculturist in the Last Year:   Transportation Needs:   . Film/video editor (Medical):   Marland Kitchen Lack of Transportation (Non-Medical):   Physical Activity:   . Days of Exercise per Week:   . Minutes of Exercise per Session:   Stress:   . Feeling of Stress :   Social Connections:   . Frequency of Communication with Friends and Family:   . Frequency of Social Gatherings with Friends and Family:   . Attends Religious Services:   . Active Member of Clubs or Organizations:   . Attends Archivist Meetings:   Marland Kitchen Marital Status:   Intimate Partner Violence:   . Fear of Current or Ex-Partner:   . Emotionally Abused:   Marland Kitchen Physically Abused:   . Sexually Abused:     Outpatient Medications Prior to Visit  Medication Sig Dispense Refill  . etonogestrel-ethinyl estradiol (NUVARING) 0.12-0.015 MG/24HR vaginal ring Insert vaginally and leave in place for 3 consecutive weeks, then remove for 1 week. 1 each 12  . MAGNESIUM CARBONATE PO Take by mouth.    . Probiotic Product (PROBIOTIC ADVANCED PO) Take by mouth.     No facility-administered medications prior to visit.    No  Known Allergies  ROS Review of Systems  Constitutional: Negative.   HENT: Negative.   Eyes: Negative.   Respiratory: Negative.   Cardiovascular: Negative.   Gastrointestinal: Negative.   Endocrine: Negative.   Genitourinary: Negative.   Musculoskeletal: Negative.   Skin: Negative.   Allergic/Immunologic: Negative.   Neurological: Negative.   Hematological: Negative.   Psychiatric/Behavioral: Negative.   All other systems reviewed and are negative.     Objective:    Physical Exam  Constitutional: She is oriented to person, place, and time. She appears well-developed and well-nourished. No distress.  Cardiovascular: Normal rate and regular rhythm.  Pulmonary/Chest: Effort normal. No respiratory distress.   Neurological: She is alert and oriented to person, place, and time.  Skin: Skin is warm and dry. No rash noted. She is not diaphoretic. No erythema. No pallor.  Psychiatric: She has a normal mood and affect. Her behavior is normal. Judgment and thought content normal.  Nursing note and vitals reviewed.   BP 120/61   Pulse 72   Temp 98.2 F (36.8 C) (Temporal)   Resp 18   Ht 5' 10.25" (1.784 m)   Wt 208 lb 3.2 oz (94.4 kg)   LMP 03/22/2020   SpO2 98%   BMI 29.66 kg/m  Wt Readings from Last 3 Encounters:  04/06/20 208 lb 3.2 oz (94.4 kg)  01/09/20 208 lb 3.2 oz (94.4 kg)  08/14/18 186 lb (84.4 kg)     There are no preventive care reminders to display for this patient.  There are no preventive care reminders to display for this patient.  Lab Results  Component Value Date   TSH 0.679 12/08/2015   Lab Results  Component Value Date   WBC 4.6 08/14/2018   HGB 12.6 08/14/2018   HCT 38.1 08/14/2018   MCV 93 08/14/2018   PLT 262 08/14/2018   Lab Results  Component Value Date   NA 141 11/24/2008   K 3.5 11/24/2008   CO2 26 11/24/2008   GLUCOSE 103 (H) 11/24/2008   BUN 9 11/24/2008   CREATININE 0.49 11/24/2008   BILITOT 1.1 11/24/2008   ALKPHOS 200 (H) 11/24/2008   AST 18 11/24/2008   ALT 13 11/24/2008   PROT 6.2 11/24/2008   ALBUMIN 3.8 11/24/2008   CALCIUM 9.0 11/24/2008   No results found for: CHOL No results found for: HDL No results found for: LDLCALC No results found for: TRIG No results found for: CHOLHDL No results found for: XNAT5T    Assessment & Plan:   Problem List Items Addressed This Visit    None    Visit Diagnoses    Menorrhagia with regular cycle    -  Primary   Relevant Medications   norethindrone (MICRONOR) 0.35 MG tablet      Meds ordered this encounter  Medications  . norethindrone (MICRONOR) 0.35 MG tablet    Sig: Take 1 tablet (0.35 mg total) by mouth daily.    Dispense:  3 Package    Refill:  4    Order Specific Question:    Supervising Provider    Answer:   Doristine Bosworth [id number]    Follow-up: No follow-ups on file.   PLAN  Start POPs  Discussed r/b/se, pt acknowledges that timing of POPs is important  No upic since lmp, can start immediately  Return if not effective. Will consider referral to Gyn if needed  Return in 3 mo for follow up  Patient encouraged to call clinic with any questions, comments, or  concerns.   Maximiano Coss, NP

## 2020-07-06 ENCOUNTER — Ambulatory Visit: Payer: Managed Care, Other (non HMO) | Admitting: Registered Nurse
